# Patient Record
Sex: Male | Born: 1950 | Race: White | Hispanic: No | Marital: Married | Smoking: Never smoker
Health system: Southern US, Community
[De-identification: ages and names within clinical notes are randomized; demographics above are authoritative.]

## PROBLEM LIST (undated history)

## (undated) DIAGNOSIS — I10 Essential (primary) hypertension: Secondary | ICD-10-CM

## (undated) DIAGNOSIS — M1990 Unspecified osteoarthritis, unspecified site: Secondary | ICD-10-CM

## (undated) DIAGNOSIS — J45909 Unspecified asthma, uncomplicated: Secondary | ICD-10-CM

## (undated) HISTORY — PX: ABLATION SAPHENOUS VEIN W/ RFA: SUR11

## (undated) HISTORY — PX: OTHER SURGICAL HISTORY: SHX169

---

## 2020-05-26 ENCOUNTER — Other Ambulatory Visit: Payer: Self-pay | Admitting: Neurosurgery

## 2020-07-09 NOTE — H&P (Signed)
Patient ID:   [id number] Patient: Alvin Lee  Date of Birth: July 28, 1951 Visit Type: Office Visit   Date: 05/25/2020 03:15 PM Provider: Marchia Meiers. Vertell Limber MD   This 69 year old male presents for Injection follow-up/back pain.  HISTORY OF PRESENT ILLNESS: 1.  Injection follow-up/back pain  The patient underwent left L2-3 transforaminal epidural steroid injection on 03/17/2020.  He underwent left L3-4 injection on 04/06/2020.  He says that both injections gave him about 3-5 days of relief.  He is continuing to complain of weakness in his left leg.  I met with the patient and his wife today in the office.  He is currently complaining of 5/10 pain.  He denies any right leg symptoms.  I have recommended left L2-3 and left L3-4 foraminotomies for nerve root compression and lateral recess stenosis.  This will be performed at [facility] on July 14, 2020      Medical/Surgical/Interim History Reviewed, no change.  Last detailed document date:02/26/2020.     PAST MEDICAL HISTORY, SURGICAL HISTORY, FAMILY HISTORY, SOCIAL HISTORY AND REVIEW OF SYSTEMS I have reviewed the patient's past medical, surgical, family and social history as well as the comprehensive review of systems as included on the [facility] history form dated 02/12/2020, which I have signed.  Family History: Reviewed, no changes.  Last detailed document date:02/26/2020.   Social History: Reviewed, no changes. Last detailed document date: 02/26/2020.    MEDICATIONS: (added, continued or stopped this visit) Started Medication Directions Instruction Stopped  albuterol sulfate     losartan 100 mg-hydrochlorothiazide 25 mg tablet take 1 tablet by oral route  every day    magnesium oxide 500 mg tablet     Motrin IB     MULTIVITAMIN     rosuvastatin 40 mg tablet take 1 tablet by oral route  every day    Vitamin D3 100 mcg (4,000 unit)  capsule       ALLERGIES: Ingredient Reaction Medication Name Comment CLINDAMYCIN    LISINOPRIL    MOXIFLOXACIN     Reviewed, no changes.    PHYSICAL EXAM:  Vitals Date Temp F BP Pulse Ht In Wt Lb BMI BSA Pain Score 05/25/2020  171/80 93 71 219.2 30.57  5/10     IMPRESSION:  Proceed with L3-4 and L2-3 laminal foraminotomies with decompression of neural elements at these 2 levels  PLAN: This is been scheduled for 10th of August.  Patient wishes to proceed with surgery.  Risks and benefits of the surgery discussed in detail with the patient he wishes to proceed  Orders: Instruction(s)/Education: Assessment Instruction I10 Lifestyle education Z68.30 Dietary management education, guidance, and counseling  Completed Orders (this encounter) Order Details Reason Side Interpretation Result Initial Treatment Date Region Lifestyle education Patient will follow up with Primary Care Physician.       Dietary management education, guidance, and counseling Encouraged patient to eat well balanced diet.        Assessment/Plan  # Detail Type Description  1. Assessment Disc displacement, lumbar (M51.26).     2. Assessment Radiculopathy, lumbar region (M54.16).     3. Assessment Lumbar foraminal stenosis (M48.061).     4. Assessment Left leg weakness (R29.898).     5. Assessment Essential (primary) hypertension (I10).     6. Assessment Body mass index (BMI) 30.0-30.9, adult (Z68.30).  Plan Orders Today's instructions / counseling include(s) Dietary management education, guidance, and counseling. Clinical information/comments: Encouraged patient to eat well balanced diet.  Pain Management Plan Pain Scale: 5/10. Method: Numeric Pain Intensity Scale. Location: back. Onset: 02/26/2020. Duration: varies. Quality: discomforting. Pain management follow-up plan of care: Patient will continue  medication management..              Provider:  Marchia Meiers. Vertell Limber MD  05/30/2020 01:06 PM    Dictation edited by: Marchia Meiers. Vertell Limber    CC Providers: Dory Larsen [facility] [place],  [place]  [id number]-   Feliz Lincoln MD  [address] [place], [place] [address]               Electronically signed by Marchia Meiers. Vertell Limber MD on 05/30/2020 01:06 PM    Patient ID:   [id number] Patient: Alvin Lee  Date of Birth: Dec 17, 1950 Visit Type: Office Visit   Date: 02/26/2020 08:30 AM Provider: Marchia Meiers. Vertell Limber MD   This 69 year old male presents for back/left hip and thigh pain.  HISTORY OF PRESENT ILLNESS: 1.  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  He recalls no injury, noting severe symptoms began September 18, 2018 upon standing from a chair.  Severe back spasms prompted cancellation of his office that day.  Physical therapy offered some relief; but by the spring of 2020, he notice some left buttock and hip pain when taking steps. He has recently resumed physical therapy at [facility] pediatric therapy, initially noticing benefit, now plateaued.  Current symptoms include left hip and buttock pain with not left foot numbness upon standing long periods.  He notes some weakness in his left thigh with gait. Ortho cleared hip.  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) Surgical history:  Vein ablation 2011 [facility], sinus surgeries 2014 and 1991 at [facility], sinus surgery in [place], esophageal dilatation after upper GI bleed 2007  Lumbar MRI 11/20/2018 uploaded  canopy  The patient currently describes pain at 4 to 5/10 and he complains of pain into his left upper thigh hip and laterally over the greater trochanter on the left but never below his knee  [facility] and summer of 2020 for his left leg and underwent an SI joint injection x1 in April 2020 but says this did not help him much at all.  The patient a is currently weighing 216 lb he says at is best he was down to 206 and at is worst 3 years ago he was up to 235 lb  The patient describes that he is not able to get relief and that when he sits he has 18 pain and when he stands he has a catch in his left buttock.  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 and he has a smaller right leg with an intact knee joint but  wears a full length leg prosthesis which does not have an articulating knee and which therefore causes him to walk with a stiff right leg       PAST MEDICAL/SURGICAL HISTORY:   (Detailed)   Disease/disorder Onset Date Management Date Comments   Sinus surgery (3)     Vein ablation, 08/23/2010     Dilation esophageal stricture, 2007   Arthritis     Asthma     Gerd     Hypertension        PAST MEDICAL HISTORY, SURGICAL HISTORY, FAMILY HISTORY, SOCIAL HISTORY AND REVIEW OF SYSTEMS I have reviewed the patient's past medical, surgical, family and social history as well as the comprehensive review of systems as included on the [facility] history form dated 02/12/2020, which I have signed.  Family History:  (Detailed)   Social  History:  (Detailed) Tobacco use reviewed. Preferred language is [place].   Tobacco use status: Current non-smoker. Smoking status: Never smoker.  SMOKING STATUS Type Smoking Status Usage Per Day Years Used Total Pack Years  Never smoker         MEDICATIONS: (added, continued or stopped this visit) Started Medication Directions Instruction Stopped  albuterol sulfate     losartan 100 mg-hydrochlorothiazide 25 mg tablet take 1 tablet by oral route  every day    magnesium oxide 500 mg tablet     Motrin IB     Multi Vitamin     rosuvastatin 40 mg tablet take 1 tablet by oral route  every day    Vitamin D3 100 mcg (4,000 unit) capsule       ALLERGIES: Ingredient Reaction Medication Name Comment CLINDAMYCIN    LISINOPRIL    MOXIFLOXACIN     Reviewed, no changes.   REVIEW OF SYSTEMS  See scanned patient registration form, dated 02/12/2020, signed and dated on 02/26/2020  Review of Systems Details System Neg/Pos Details Constitutional Negative Chills, Fatigue, Fever, Malaise, Night sweats, Weight gain and Weight loss. ENMT Negative Ear drainage, Hearing loss, Nasal drainage, Otalgia, Sinus pressure and Sore throat. Eyes Negative Eye discharge, Eye pain and Vision changes. Respiratory Negative Chronic cough, Cough, Dyspnea, Known TB exposure and Wheezing. Cardio Negative Chest pain, Claudication, Edema and Irregular heartbeat/palpitations. GI Negative Abdominal pain, Blood in stool, Change in stool pattern, Constipation, Decreased appetite, Diarrhea, Heartburn, Nausea and Vomiting. GU Negative Dribbling, Dysuria, Erectile dysfunction, Hematuria, Polyuria (Genitourinary), Slow stream, Urinary frequency, Urinary incontinence and Urinary retention. Endocrine Negative Cold intolerance, Heat intolerance, Polydipsia and Polyphagia. Neuro Positive Extremity weakness, Gait disturbance, Numbness in  extremity. Psych Negative Anxiety, Depression and Insomnia. Integumentary Negative Brittle hair, Brittle nails, Change in shape/size of mole(s), Hair loss, Hirsutism, Hives, Pruritus, Rash and Skin lesion. MS Positive Back pain, Left hip pain. Hema/Lymph Negative Easy bleeding, Easy bruising and Lymphadenopathy. Allergic/Immuno Negative Contact allergy, Environmental allergies, Food allergies and Seasonal allergies. Reproductive Negative Penile discharge and Sexual dysfunction.  PHYSICAL EXAM:  Vitals Date Temp F BP Pulse Ht In Wt Lb BMI BSA Pain Score 02/26/2020 97.1 148/81 85 71 225.8 31.49  5/10   PHYSICAL EXAM Details General Level of Distress: no acute distress Overall Appearance: normal  Head and Face  Right Left  Fundoscopic Exam:  normal normal    Cardiovascular Cardiac: regular rate and rhythm without murmur  Right Left  Carotid Pulses: normal normal  Respiratory Lungs: clear to auscultation  Neurological Orientation: normal Recent and Remote Memory: normal Attention Span and Concentration:   normal Language: normal [employer]  of Knowledge: normal  Right Left Sensation: normal normal Upper Extremity Coordination: normal normal  Lower Extremity Coordination: normal normal  Musculoskeletal Gait and Station: normal  Right Left Upper Extremity Muscle Strength: normal normal Lower Extremity Muscle Strength: normal normal Upper Extremity Muscle Tone:  normal normal Lower Extremity Muscle Tone: normal normal   Motor Strength Upper and lower extremity motor strength was tested in the clinically pertinent muscles. Any abnormal findings will be noted below.   Right Left Hip Flexor:  4/5   Deep Tendon Reflexes  Right Left Biceps: normal normal Triceps: normal normal Brachioradialis: normal normal Patellar: normal normal Achilles: normal normal  Sensory Sensation was tested at L1 to S1.   Cranial Nerves II. Optic Nerve/Visual  Fields: normal III. Oculomotor: normal IV. Trochlear: normal V. Trigeminal: normal VI. Abducens: normal VII. Facial: normal VIII. Acoustic/Vestibular: normal IX. Glossopharyngeal: normal X. Vagus: normal XI. Spinal Accessory: normal XII. 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  Tenderness over left upper thigh and hip and laterally over the greater trochanter    IMPRESSION:  I believe that the patient has left leg weakness and pain relates to his spinal stenosis at the L2-3 level and is likely worsened by his abnormal gait due to his congenital anomaly of his right lower extremity, which causes more stress on the left side of his back and left leg  PLAN: I have recommended a lumbar MRI be obtained to see if there is been interval worsening of these degenerative changes in the lumbar spine, most concerning regarding the left L2-3 level.  I told him he may well do well with injections depending on the results of this new MRI and also lumbar radiographs to assess for any evidence of instability  Orders: Diagnostic Procedures: Assessment Procedure M25.552 Hip - Left Lateral W/AP Pelvis M54.16 Lumbar Spine- AP/Lat/Flex/Ex M54.16 MRI Spine/lumb W/o Contrast Instruction(s)/Education: Assessment Instruction I10 Lifestyle education Z68.31 Dietary management education, guidance, and counseling  Completed Orders (this encounter) Order Details Reason Side Interpretation Result Initial Treatment Date Region Lifestyle education Patient will follow up with  Primary Care Physician.       Dietary management education, guidance, and counseling Encouraged patient to eat well balanced diet.        Assessment/Plan  # Detail Type Description  1. Assessment Disc displacement, lumbar (M51.26).     2. 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     3. Assessment Left hip pain (M25.552).     4. Assessment Lumbar radiculopathy (M54.16).     5. Assessment Essential (primary) hypertension (I10).     6. Assessment Body mass index (BMI) 31.0-31.9, adult (Z68.31).  Plan Orders Today's instructions / counseling include(s) Dietary management education, guidance, and counseling. Clinical information/comments: Encouraged patient to eat well balanced diet.       Pain Management Plan Pain Scale: 5/10. Method: Numeric Pain Intensity Scale. Location: back, left hip and thigh. Onset: 02/26/2020. Duration: varies. Quality: discomforting. Pain management follow-up plan of care: Patient will continue medication management..              Provider:  Marchia Meiers. Vertell Limber MD  03/01/2020 02:43 PM  Dictation edited by: Marchia Meiers Vertell Limber    CC Providers: Dory Larsen [facility] [place],  [place]  [id number]-   Felicita Nuncio MD  [address] [place], [place] [address]               Electronically signed by Marchia Meiers. Vertell Limber MD on 03/01/2020 02:43 PM

## 2020-07-10 ENCOUNTER — Encounter (HOSPITAL_COMMUNITY)
Admission: RE | Admit: 2020-07-10 | Discharge: 2020-07-10 | Disposition: A | Discharge status: Home / Self Care | Payer: Medicare Other | Source: Ambulatory Visit | Attending: Neurosurgery | Admitting: Neurosurgery

## 2020-07-10 ENCOUNTER — Other Ambulatory Visit (HOSPITAL_COMMUNITY)
Admission: RE | Admit: 2020-07-10 | Discharge: 2020-07-10 | Disposition: A | Discharge status: Home / Self Care | Payer: Medicare Other | Source: Ambulatory Visit | Attending: Neurosurgery | Admitting: Neurosurgery

## 2020-07-10 ENCOUNTER — Encounter (HOSPITAL_COMMUNITY): Payer: Self-pay

## 2020-07-10 ENCOUNTER — Other Ambulatory Visit: Payer: Self-pay

## 2020-07-10 DIAGNOSIS — Z20822 Contact with and (suspected) exposure to covid-19: Secondary | ICD-10-CM | POA: Insufficient documentation

## 2020-07-10 DIAGNOSIS — Z01818 Encounter for other preprocedural examination: Secondary | ICD-10-CM | POA: Diagnosis present

## 2020-07-10 DIAGNOSIS — Z01812 Encounter for preprocedural laboratory examination: Secondary | ICD-10-CM | POA: Insufficient documentation

## 2020-07-10 HISTORY — DX: Unspecified asthma, uncomplicated: J45.909

## 2020-07-10 HISTORY — DX: Unspecified osteoarthritis, unspecified site: M19.90

## 2020-07-10 HISTORY — DX: Essential (primary) hypertension: I10

## 2020-07-10 LAB — CBC
HCT: 49 % (ref 39.0–52.0)
Hemoglobin: 16.2 g/dL (ref 13.0–17.0)
MCH: 31 pg (ref 26.0–34.0)
MCHC: 33.1 g/dL (ref 30.0–36.0)
MCV: 93.7 fL (ref 80.0–100.0)
Platelets: 206 10*3/uL (ref 150–400)
RBC: 5.23 MIL/uL (ref 4.22–5.81)
RDW: 12.7 % (ref 11.5–15.5)
WBC: 6 10*3/uL (ref 4.0–10.5)
nRBC: 0 % (ref 0.0–0.2)

## 2020-07-10 LAB — SARS CORONAVIRUS 2 (TAT 6-24 HRS): SARS Coronavirus 2: NEGATIVE

## 2020-07-10 LAB — BASIC METABOLIC PANEL
Anion gap: 9 (ref 5–15)
BUN: 23 mg/dL (ref 8–23)
CO2: 30 mmol/L (ref 22–32)
Calcium: 10 mg/dL (ref 8.9–10.3)
Chloride: 102 mmol/L (ref 98–111)
Creatinine, Ser: 1.15 mg/dL (ref 0.61–1.24)
GFR calc Af Amer: >60 mL/min (ref >60)
GFR calc non Af Amer: >60 mL/min (ref >60)
Glucose, Bld: 111 mg/dL — ABNORMAL HIGH (ref 70–99)
Potassium: 3.9 mmol/L (ref 3.5–5.1)
Sodium: 141 mmol/L (ref 135–145)

## 2020-07-10 LAB — SURGICAL PCR SCREEN
MRSA, PCR: NEGATIVE
Staphylococcus aureus: POSITIVE — AB

## 2020-07-10 MED ORDER — CHLORHEXIDINE GLUCONATE CLOTH 2 % EX PADS: 6.0000 | MEDICATED_PAD | Freq: Once | CUTANEOUS | Status: DC

## 2020-07-10 NOTE — Progress Notes (Signed)
[facility] - [place], [place] - [address] [facility] [address] [place] [address] Phone: [phone] Fax: [phone]  [facility] [phone] [place], [place] - [address] AT [facility] [address] [place] [address] Phone: [phone] Fax: [phone]      Your procedure is scheduled on 07/14/20.  Report to [facility] Main Entrance "A" at 5:30 A.M., and check in at the Admitting office.  Call this number if you have problems the morning of surgery:  [phone]  Call [phone] if you have any questions prior to your surgery date Monday-Friday 8am-4pm    Remember:  Do not eat or drink after midnight the night before your surgery    Take these medicines the morning of surgery with A SIP OF WATER: omeprazole (PRILOSEC)   As of today, STOP taking any Aspirin (unless otherwise instructed by your surgeon) Aleve, Naproxen, Ibuprofen, Motrin, Advil, Goody's, BC's, all herbal medications, fish oil, and all vitamins.                      Do not wear jewelry            Do not wear lotions, powders, perfumes or deodorant.            Do not shave 48 hours prior to surgery.              Do not bring valuables to the hospital.            [facility] is not responsible for any belongings or valuables.  Do NOT Smoke (Tobacco/Vaping) or drink Alcohol 24 hours prior to your procedure If you use a CPAP at night, you may bring all equipment for your overnight stay.   Contacts, glasses, dentures or bridgework may not be worn into surgery.      For patients admitted to the hospital, discharge time will be determined by your treatment team.   Patients discharged the day of surgery will not be allowed to drive home, and someone needs to stay with them for 24 hours.    Special instructions:   [place]- Preparing For Surgery  Before surgery, you can play an important role. Because skin is not sterile, your skin needs to be as free of germs  as possible. You can reduce the number of germs on your skin by washing with CHG (chlorahexidine gluconate) Soap before surgery.  CHG is an antiseptic cleaner which kills germs and bonds with the skin to continue killing germs even after washing.    Oral Hygiene is also important to reduce your risk of infection.  Remember - BRUSH YOUR TEETH THE MORNING OF SURGERY WITH YOUR REGULAR TOOTHPASTE  Please do not use if you have an allergy to CHG or antibacterial soaps. If your skin becomes reddened/irritated stop using the CHG.  Do not shave (including legs and underarms) for at least 48 hours prior to first CHG shower. It is OK to shave your face.  Please follow these instructions carefully.   1. Shower the NIGHT BEFORE SURGERY and the MORNING OF SURGERY with CHG Soap.   2. If you chose to wash your hair, wash your hair first as usual with your normal shampoo.  3. After you shampoo, rinse your hair and body thoroughly to remove the shampoo.  4. Use CHG as you would any other liquid soap. You can apply CHG directly to the skin and wash gently with a scrungie or a clean  washcloth.   5. Apply the CHG Soap to your body ONLY FROM THE NECK DOWN.  Do not use on open wounds or open sores. Avoid contact with your eyes, ears, mouth and genitals (private parts). Wash Face and genitals (private parts)  with your normal soap.   6. Wash thoroughly, paying special attention to the area where your surgery will be performed.  7. Thoroughly rinse your body with warm water from the neck down.  8. DO NOT shower/wash with your normal soap after using and rinsing off the CHG Soap.  9. Pat yourself dry with a CLEAN TOWEL.  10. Wear CLEAN PAJAMAS to bed the night before surgery  11. Place CLEAN SHEETS on your bed the night of your first shower and DO NOT SLEEP WITH PETS.   Day of Surgery: Wear Clean/Comfortable clothing the morning of surgery Do not apply any deodorants/lotions.   Remember to brush your teeth  WITH YOUR REGULAR TOOTHPASTE.   Please read over the following fact sheets that you were given.

## 2020-07-10 NOTE — Progress Notes (Signed)
PCP - M [facility]ED[facility]   [facility]     [phone] Cardiologist - NA      -         Chest x-ray - NA EKG - TODAY Stress Test - NA ECHO - NA Cardiac Cath - NA   -             Blood Thinner Instructions:NO Aspirin Instructions:STOP    COVID TEST- FOR TODAY   Anesthesia review:  HX HTN Patient denies shortness of breath, fever, cough and chest pain at PAT appointment   All instructions explained to the patient, with a verbal understanding of the material. Patient agrees to go over the instructions while at home for a better understanding. Patient also instructed to self quarantine after being tested for COVID-19. The opportunity to ask questions was provided.

## 2020-07-13 NOTE — Anesthesia Preprocedure Evaluation (Addendum)
Anesthesia Evaluation  Patient identified by MRN, date of birth, ID band Patient awake    Reviewed: Allergy & Precautions, NPO status , Patient's Chart, lab work & pertinent test results  Airway Mallampati: I  TM Distance: >3 FB Neck ROM: Full    Dental  (+) Teeth Intact, Dental Advisory Given   Pulmonary asthma ,  Uses rescue inhaler very infrequently    Pulmonary exam normal breath sounds clear to auscultation       Cardiovascular hypertension, Pt. on medications Normal cardiovascular exam Rhythm:Regular Rate:Normal     Neuro/Psych negative neurological ROS  negative psych ROS   GI/Hepatic Neg liver ROS, GERD  Medicated and Controlled,  Endo/Other  negative endocrine ROS  Renal/GU negative Renal ROS  negative genitourinary   Musculoskeletal  (+) Arthritis , Osteoarthritis,  Lumbar stenosis    Abdominal   Peds  Hematology negative hematology ROS (+) hct 49   Anesthesia Other Findings   Reproductive/Obstetrics negative OB ROS                           Anesthesia Physical Anesthesia Plan  ASA: II  Anesthesia Plan: General   Post-op Pain Management:    Induction: Intravenous  PONV Risk Score and Plan: 2 and Ondansetron, Dexamethasone, Treatment may vary due to age or medical condition and Midazolam  Airway Management Planned: Oral ETT  Additional Equipment: None  Intra-op Plan:   Post-operative Plan: Extubation in OR  Informed Consent: I have reviewed the patients History and Physical, chart, labs and discussed the procedure including the risks, benefits and alternatives for the proposed anesthesia with the patient or authorized representative who has indicated his/her understanding and acceptance.     Dental advisory given  Plan Discussed with: CRNA  Anesthesia Plan Comments: (  )     Anesthesia Quick Evaluation

## 2020-07-14 ENCOUNTER — Encounter (HOSPITAL_COMMUNITY): Payer: Self-pay | Admitting: Neurosurgery

## 2020-07-14 ENCOUNTER — Ambulatory Visit (HOSPITAL_COMMUNITY): Payer: Medicare Other | Admitting: Physician Assistant

## 2020-07-14 ENCOUNTER — Ambulatory Visit (HOSPITAL_COMMUNITY): Payer: Medicare Other

## 2020-07-14 ENCOUNTER — Encounter (HOSPITAL_COMMUNITY)
Admission: RE | Disposition: A | Discharge status: Home / Self Care | Payer: Self-pay | Source: Home / Self Care | Attending: Neurosurgery

## 2020-07-14 ENCOUNTER — Ambulatory Visit (HOSPITAL_COMMUNITY): Payer: Medicare Other | Admitting: Certified Registered Nurse Anesthetist

## 2020-07-14 ENCOUNTER — Observation Stay (HOSPITAL_COMMUNITY)
Admission: RE | Admit: 2020-07-14 | Discharge: 2020-07-15 | Disposition: A | Discharge status: Home / Self Care | Payer: Medicare Other | Attending: Neurosurgery | Admitting: Neurosurgery

## 2020-07-14 ENCOUNTER — Other Ambulatory Visit: Payer: Self-pay

## 2020-07-14 DIAGNOSIS — I10 Essential (primary) hypertension: Secondary | ICD-10-CM | POA: Insufficient documentation

## 2020-07-14 DIAGNOSIS — M5126 Other intervertebral disc displacement, lumbar region: Secondary | ICD-10-CM | POA: Diagnosis not present

## 2020-07-14 DIAGNOSIS — Z6830 Body mass index (BMI) 30.0-30.9, adult: Secondary | ICD-10-CM | POA: Diagnosis not present

## 2020-07-14 DIAGNOSIS — Z79899 Other long term (current) drug therapy: Secondary | ICD-10-CM | POA: Insufficient documentation

## 2020-07-14 DIAGNOSIS — M5416 Radiculopathy, lumbar region: Secondary | ICD-10-CM | POA: Diagnosis not present

## 2020-07-14 DIAGNOSIS — M48061 Spinal stenosis, lumbar region without neurogenic claudication: Principal | ICD-10-CM | POA: Insufficient documentation

## 2020-07-14 DIAGNOSIS — M545 Low back pain: Secondary | ICD-10-CM | POA: Diagnosis present

## 2020-07-14 DIAGNOSIS — Z419 Encounter for procedure for purposes other than remedying health state, unspecified: Secondary | ICD-10-CM

## 2020-07-14 HISTORY — PX: LUMBAR LAMINECTOMY/DECOMPRESSION MICRODISCECTOMY: SHX5026

## 2020-07-14 SURGERY — LUMBAR LAMINECTOMY/DECOMPRESSION MICRODISCECTOMY 2 LEVELS
Anesthesia: General | Laterality: Left

## 2020-07-14 MED ORDER — CHLORHEXIDINE GLUCONATE 0.12 % MT SOLN: 15.0000 mL | Freq: Once | OROMUCOSAL | Status: AC

## 2020-07-14 MED ORDER — PHENOL 1.4 % MT LIQD: 1.0000 | OROMUCOSAL | Status: DC | PRN

## 2020-07-14 MED ORDER — DOCUSATE SODIUM 100 MG PO CAPS
100.0000 mg | ORAL_CAPSULE | Freq: Two times a day (BID) | ORAL | Status: DC
  Administered 2020-07-14 – 2020-07-15 (×3): 100 mg via ORAL
  Filled 2020-07-14 (×3): qty 1

## 2020-07-14 MED ORDER — ROSUVASTATIN CALCIUM 20 MG PO TABS
40.0000 mg | ORAL_TABLET | Freq: Every day | ORAL | Status: DC
  Administered 2020-07-14: 40 mg via ORAL
  Filled 2020-07-14: qty 2

## 2020-07-14 MED ORDER — HYDROMORPHONE HCL 1 MG/ML IJ SOLN: 0.5000 mg | INTRAMUSCULAR | Status: DC | PRN

## 2020-07-14 MED ORDER — CHLORHEXIDINE GLUCONATE 0.12 % MT SOLN
OROMUCOSAL | Status: AC
  Administered 2020-07-14: 15 mL via OROMUCOSAL
  Filled 2020-07-14: qty 15

## 2020-07-14 MED ORDER — PHENYLEPHRINE 40 MCG/ML (10ML) SYRINGE FOR IV PUSH (FOR BLOOD PRESSURE SUPPORT)
PREFILLED_SYRINGE | INTRAVENOUS | Status: DC | PRN
  Administered 2020-07-14: 80 ug via INTRAVENOUS
  Administered 2020-07-14: 120 ug via INTRAVENOUS
  Administered 2020-07-14 (×5): 80 ug via INTRAVENOUS

## 2020-07-14 MED ORDER — LIDOCAINE 2% (20 MG/ML) 5 ML SYRINGE
INTRAMUSCULAR | Status: DC | PRN
  Administered 2020-07-14: 60 mg via INTRAVENOUS

## 2020-07-14 MED ORDER — PROPOFOL 10 MG/ML IV BOLUS
INTRAVENOUS | Status: DC | PRN
  Administered 2020-07-14: 150 mg via INTRAVENOUS

## 2020-07-14 MED ORDER — LIDOCAINE-EPINEPHRINE 1 %-1:100000 IJ SOLN
INTRAMUSCULAR | Status: DC | PRN
  Administered 2020-07-14: 5 mL

## 2020-07-14 MED ORDER — LOSARTAN POTASSIUM-HCTZ 100-25 MG PO TABS: 1.0000 | ORAL_TABLET | Freq: Every day | ORAL | Status: DC

## 2020-07-14 MED ORDER — THROMBIN 5000 UNITS EX SOLR
OROMUCOSAL | Status: DC | PRN
  Administered 2020-07-14: 5 mL via TOPICAL

## 2020-07-14 MED ORDER — IBUPROFEN 200 MG PO TABS: 600.0000 mg | ORAL_TABLET | Freq: Four times a day (QID) | ORAL | Status: DC | PRN

## 2020-07-14 MED ORDER — MIDAZOLAM HCL 2 MG/2ML IJ SOLN
INTRAMUSCULAR | Status: DC | PRN
  Administered 2020-07-14: 2 mg via INTRAVENOUS

## 2020-07-14 MED ORDER — METHOCARBAMOL 500 MG PO TABS
500.0000 mg | ORAL_TABLET | Freq: Four times a day (QID) | ORAL | Status: DC | PRN
  Administered 2020-07-14: 500 mg via ORAL
  Filled 2020-07-14: qty 1

## 2020-07-14 MED ORDER — OXYCODONE HCL 5 MG PO TABS
5.0000 mg | ORAL_TABLET | Freq: Once | ORAL | Status: AC | PRN
  Administered 2020-07-14: 5 mg via ORAL

## 2020-07-14 MED ORDER — EPHEDRINE 5 MG/ML INJ
INTRAVENOUS | Status: AC
  Filled 2020-07-14: qty 10

## 2020-07-14 MED ORDER — DEXAMETHASONE SODIUM PHOSPHATE 10 MG/ML IJ SOLN
INTRAMUSCULAR | Status: DC | PRN
  Administered 2020-07-14: 10 mg via INTRAVENOUS

## 2020-07-14 MED ORDER — SODIUM CHLORIDE 0.9 % IV SOLN: 250.0000 mL | INTRAVENOUS | Status: DC

## 2020-07-14 MED ORDER — SODIUM CHLORIDE 0.9% FLUSH: 3.0000 mL | INTRAVENOUS | Status: DC | PRN

## 2020-07-14 MED ORDER — POLYETHYLENE GLYCOL 3350 17 G PO PACK: 17.0000 g | PACK | Freq: Every day | ORAL | Status: DC | PRN

## 2020-07-14 MED ORDER — ROCURONIUM BROMIDE 10 MG/ML (PF) SYRINGE
PREFILLED_SYRINGE | INTRAVENOUS | Status: DC | PRN
  Administered 2020-07-14: 100 mg via INTRAVENOUS

## 2020-07-14 MED ORDER — FLEET ENEMA 7-19 GM/118ML RE ENEM: 1.0000 | ENEMA | Freq: Once | RECTAL | Status: DC | PRN

## 2020-07-14 MED ORDER — KCL IN DEXTROSE-NACL 20-5-0.45 MEQ/L-%-% IV SOLN: INTRAVENOUS | Status: DC

## 2020-07-14 MED ORDER — FENTANYL CITRATE (PF) 250 MCG/5ML IJ SOLN
INTRAMUSCULAR | Status: AC
  Filled 2020-07-14: qty 5

## 2020-07-14 MED ORDER — ACETAMINOPHEN 500 MG PO TABS
1000.0000 mg | ORAL_TABLET | Freq: Once | ORAL | Status: AC
  Administered 2020-07-14: 1000 mg via ORAL
  Filled 2020-07-14: qty 2

## 2020-07-14 MED ORDER — PROPOFOL 10 MG/ML IV BOLUS
INTRAVENOUS | Status: AC
  Filled 2020-07-14: qty 40

## 2020-07-14 MED ORDER — ALUM & MAG HYDROXIDE-SIMETH 200-200-20 MG/5ML PO SUSP: 30.0000 mL | Freq: Four times a day (QID) | ORAL | Status: DC | PRN

## 2020-07-14 MED ORDER — ACETAMINOPHEN 650 MG RE SUPP: 650.0000 mg | RECTAL | Status: DC | PRN

## 2020-07-14 MED ORDER — SUGAMMADEX SODIUM 200 MG/2ML IV SOLN
INTRAVENOUS | Status: DC | PRN
  Administered 2020-07-14: 200 mg via INTRAVENOUS

## 2020-07-14 MED ORDER — LIDOCAINE 2% (20 MG/ML) 5 ML SYRINGE
INTRAMUSCULAR | Status: AC
  Filled 2020-07-14: qty 5

## 2020-07-14 MED ORDER — FENTANYL CITRATE (PF) 100 MCG/2ML IJ SOLN
INTRAMUSCULAR | Status: DC | PRN
  Administered 2020-07-14: 100 ug via INTRAVENOUS

## 2020-07-14 MED ORDER — MENTHOL 3 MG MT LOZG: 1.0000 | LOZENGE | OROMUCOSAL | Status: DC | PRN

## 2020-07-14 MED ORDER — LOSARTAN POTASSIUM 50 MG PO TABS
100.0000 mg | ORAL_TABLET | Freq: Every day | ORAL | Status: DC
  Administered 2020-07-14 – 2020-07-15 (×2): 100 mg via ORAL
  Filled 2020-07-14 (×2): qty 2

## 2020-07-14 MED ORDER — PHENYLEPHRINE 40 MCG/ML (10ML) SYRINGE FOR IV PUSH (FOR BLOOD PRESSURE SUPPORT)
PREFILLED_SYRINGE | INTRAVENOUS | Status: AC
  Filled 2020-07-14: qty 10

## 2020-07-14 MED ORDER — ORAL CARE MOUTH RINSE: 15.0000 mL | Freq: Once | OROMUCOSAL | Status: AC

## 2020-07-14 MED ORDER — PHENYLEPHRINE HCL-NACL 10-0.9 MG/250ML-% IV SOLN
INTRAVENOUS | Status: DC | PRN
  Administered 2020-07-14: 20 ug/min via INTRAVENOUS

## 2020-07-14 MED ORDER — OXYCODONE HCL 5 MG/5ML PO SOLN: 5.0000 mg | Freq: Once | ORAL | Status: AC | PRN

## 2020-07-14 MED ORDER — ACETAMINOPHEN 325 MG PO TABS: 650.0000 mg | ORAL_TABLET | ORAL | Status: DC | PRN

## 2020-07-14 MED ORDER — BUPIVACAINE HCL (PF) 0.5 % IJ SOLN
INTRAMUSCULAR | Status: DC | PRN
  Administered 2020-07-14: 5 mL

## 2020-07-14 MED ORDER — HYDROCHLOROTHIAZIDE 25 MG PO TABS
25.0000 mg | ORAL_TABLET | Freq: Every day | ORAL | Status: DC
  Administered 2020-07-14 – 2020-07-15 (×2): 25 mg via ORAL
  Filled 2020-07-14 (×2): qty 1

## 2020-07-14 MED ORDER — PANTOPRAZOLE SODIUM 40 MG IV SOLR
40.0000 mg | Freq: Every day | INTRAVENOUS | Status: DC
  Administered 2020-07-14: 40 mg via INTRAVENOUS
  Filled 2020-07-14: qty 40

## 2020-07-14 MED ORDER — ONDANSETRON HCL 4 MG PO TABS: 4.0000 mg | ORAL_TABLET | Freq: Four times a day (QID) | ORAL | Status: DC | PRN

## 2020-07-14 MED ORDER — ADULT MULTIVITAMIN W/MINERALS CH
1.0000 | ORAL_TABLET | Freq: Every day | ORAL | Status: DC
  Administered 2020-07-14 – 2020-07-15 (×2): 1 via ORAL
  Filled 2020-07-14 (×2): qty 1

## 2020-07-14 MED ORDER — HYDROCODONE-ACETAMINOPHEN 5-325 MG PO TABS
1.0000 | ORAL_TABLET | ORAL | Status: DC | PRN
  Administered 2020-07-14 – 2020-07-15 (×4): 1 via ORAL
  Filled 2020-07-14: qty 1
  Filled 2020-07-14: qty 2
  Filled 2020-07-14 (×2): qty 1

## 2020-07-14 MED ORDER — MIDAZOLAM HCL 2 MG/2ML IJ SOLN
INTRAMUSCULAR | Status: AC
  Filled 2020-07-14: qty 2

## 2020-07-14 MED ORDER — ZOLPIDEM TARTRATE 5 MG PO TABS: 5.0000 mg | ORAL_TABLET | Freq: Every evening | ORAL | Status: DC | PRN

## 2020-07-14 MED ORDER — ONDANSETRON HCL 4 MG/2ML IJ SOLN
INTRAMUSCULAR | Status: AC
  Filled 2020-07-14: qty 2

## 2020-07-14 MED ORDER — OXYCODONE HCL 5 MG PO TABS
5.0000 mg | ORAL_TABLET | ORAL | Status: DC | PRN
  Filled 2020-07-14: qty 1

## 2020-07-14 MED ORDER — ONDANSETRON HCL 4 MG/2ML IJ SOLN: 4.0000 mg | Freq: Four times a day (QID) | INTRAMUSCULAR | Status: DC | PRN

## 2020-07-14 MED ORDER — OXYCODONE HCL 5 MG PO TABS
ORAL_TABLET | ORAL | Status: AC
  Filled 2020-07-14: qty 1

## 2020-07-14 MED ORDER — METHOCARBAMOL 1000 MG/10ML IJ SOLN
500.0000 mg | Freq: Four times a day (QID) | INTRAVENOUS | Status: DC | PRN
  Filled 2020-07-14: qty 5

## 2020-07-14 MED ORDER — CEFAZOLIN SODIUM-DEXTROSE 2-4 GM/100ML-% IV SOLN
2.0000 g | Freq: Three times a day (TID) | INTRAVENOUS | Status: AC
  Administered 2020-07-14 (×2): 2 g via INTRAVENOUS
  Filled 2020-07-14 (×2): qty 100

## 2020-07-14 MED ORDER — SODIUM CHLORIDE 0.9% FLUSH
3.0000 mL | Freq: Two times a day (BID) | INTRAVENOUS | Status: DC
  Administered 2020-07-15: 3 mL via INTRAVENOUS

## 2020-07-14 MED ORDER — 0.9 % SODIUM CHLORIDE (POUR BTL) OPTIME
TOPICAL | Status: DC | PRN
  Administered 2020-07-14: 1000 mL

## 2020-07-14 MED ORDER — METHYLPREDNISOLONE ACETATE 80 MG/ML IJ SUSP
INTRAMUSCULAR | Status: DC | PRN
  Administered 2020-07-14: 80 mg

## 2020-07-14 MED ORDER — CEFAZOLIN SODIUM-DEXTROSE 2-4 GM/100ML-% IV SOLN
2.0000 g | INTRAVENOUS | Status: AC
  Administered 2020-07-14: 2 g via INTRAVENOUS
  Filled 2020-07-14: qty 100

## 2020-07-14 MED ORDER — PANTOPRAZOLE SODIUM 40 MG PO TBEC: 40.0000 mg | DELAYED_RELEASE_TABLET | Freq: Every day | ORAL | Status: DC

## 2020-07-14 MED ORDER — VITAMIN D 25 MCG (1000 UNIT) PO TABS
2000.0000 [IU] | ORAL_TABLET | Freq: Every day | ORAL | Status: DC
  Administered 2020-07-14 – 2020-07-15 (×2): 2000 [IU] via ORAL
  Filled 2020-07-14 (×2): qty 2

## 2020-07-14 MED ORDER — LIDOCAINE-EPINEPHRINE 1 %-1:100000 IJ SOLN
INTRAMUSCULAR | Status: AC
  Filled 2020-07-14: qty 1

## 2020-07-14 MED ORDER — FENTANYL CITRATE (PF) 100 MCG/2ML IJ SOLN
INTRAMUSCULAR | Status: AC
  Filled 2020-07-14: qty 2

## 2020-07-14 MED ORDER — BUPIVACAINE HCL (PF) 0.5 % IJ SOLN
INTRAMUSCULAR | Status: AC
  Filled 2020-07-14: qty 30

## 2020-07-14 MED ORDER — METHYLPREDNISOLONE ACETATE 80 MG/ML IJ SUSP
INTRAMUSCULAR | Status: AC
  Filled 2020-07-14: qty 1

## 2020-07-14 MED ORDER — ONDANSETRON HCL 4 MG/2ML IJ SOLN: 4.0000 mg | Freq: Once | INTRAMUSCULAR | Status: DC | PRN

## 2020-07-14 MED ORDER — BISACODYL 10 MG RE SUPP: 10.0000 mg | Freq: Every day | RECTAL | Status: DC | PRN

## 2020-07-14 MED ORDER — LACTATED RINGERS IV SOLN: INTRAVENOUS | Status: DC

## 2020-07-14 MED ORDER — HYDROMORPHONE HCL 1 MG/ML IJ SOLN: 0.2500 mg | INTRAMUSCULAR | Status: DC | PRN

## 2020-07-14 MED ORDER — DEXAMETHASONE SODIUM PHOSPHATE 10 MG/ML IJ SOLN
INTRAMUSCULAR | Status: AC
  Filled 2020-07-14: qty 1

## 2020-07-14 MED ORDER — ONDANSETRON HCL 4 MG/2ML IJ SOLN
INTRAMUSCULAR | Status: DC | PRN
  Administered 2020-07-14: 4 mg via INTRAVENOUS

## 2020-07-14 MED ORDER — FENTANYL CITRATE (PF) 250 MCG/5ML IJ SOLN
INTRAMUSCULAR | Status: DC | PRN
  Administered 2020-07-14: 100 ug via INTRAVENOUS
  Administered 2020-07-14: 50 ug via INTRAVENOUS

## 2020-07-14 MED ORDER — ROCURONIUM BROMIDE 10 MG/ML (PF) SYRINGE
PREFILLED_SYRINGE | INTRAVENOUS | Status: AC
  Filled 2020-07-14: qty 10

## 2020-07-14 MED ORDER — THROMBIN 5000 UNITS EX SOLR
CUTANEOUS | Status: AC
  Filled 2020-07-14: qty 5000

## 2020-07-14 SURGICAL SUPPLY — 52 items
BAND RUBBER #18 3X1/16 STRL (MISCELLANEOUS) ×4 IMPLANT
BLADE CLIPPER SURG (BLADE) IMPLANT
BUR MATCHSTICK NEURO 3.0 LAGG (BURR) ×2 IMPLANT
BUR ROUND FLUTED 5 RND (BURR) ×2 IMPLANT
CANISTER SUCT 3000ML PPV (MISCELLANEOUS) ×2 IMPLANT
CARTRIDGE OIL MAESTRO DRILL (MISCELLANEOUS) ×1 IMPLANT
COVER WAND RF STERILE (DRAPES) IMPLANT
DECANTER SPIKE VIAL GLASS SM (MISCELLANEOUS) ×2 IMPLANT
DERMABOND ADVANCED (GAUZE/BANDAGES/DRESSINGS) ×1
DERMABOND ADVANCED .7 DNX12 (GAUZE/BANDAGES/DRESSINGS) ×1 IMPLANT
DIFFUSER DRILL AIR PNEUMATIC (MISCELLANEOUS) ×2 IMPLANT
DRAPE LAPAROTOMY 100X72X124 (DRAPES) ×2 IMPLANT
DRAPE MICROSCOPE LEICA (MISCELLANEOUS) ×2 IMPLANT
DRAPE SURG 17X23 STRL (DRAPES) ×2 IMPLANT
DRSG OPSITE 4X5.5 SM (GAUZE/BANDAGES/DRESSINGS) ×2 IMPLANT
DURAPREP 26ML APPLICATOR (WOUND CARE) ×2 IMPLANT
ELECT REM PT RETURN 9FT ADLT (ELECTROSURGICAL) ×2
ELECTRODE REM PT RTRN 9FT ADLT (ELECTROSURGICAL) ×1 IMPLANT
GAUZE 4X4 16PLY RFD (DISPOSABLE) IMPLANT
GAUZE SPONGE 4X4 12PLY STRL (GAUZE/BANDAGES/DRESSINGS) IMPLANT
GLOVE BIO SURGEON STRL SZ8 (GLOVE) ×2 IMPLANT
GLOVE BIOGEL PI IND STRL 8 (GLOVE) ×1 IMPLANT
GLOVE BIOGEL PI IND STRL 8.5 (GLOVE) ×1 IMPLANT
GLOVE BIOGEL PI INDICATOR 8 (GLOVE) ×1
GLOVE BIOGEL PI INDICATOR 8.5 (GLOVE) ×1
GLOVE ECLIPSE 8.0 STRL XLNG CF (GLOVE) ×2 IMPLANT
GLOVE EXAM NITRILE XL STR (GLOVE) IMPLANT
GOWN STRL REUS W/ TWL LRG LVL3 (GOWN DISPOSABLE) IMPLANT
GOWN STRL REUS W/ TWL XL LVL3 (GOWN DISPOSABLE) ×1 IMPLANT
GOWN STRL REUS W/TWL 2XL LVL3 (GOWN DISPOSABLE) ×2 IMPLANT
GOWN STRL REUS W/TWL LRG LVL3 (GOWN DISPOSABLE)
GOWN STRL REUS W/TWL XL LVL3 (GOWN DISPOSABLE) ×1
HEMOSTAT POWDER KIT SURGIFOAM (HEMOSTASIS) ×2 IMPLANT
KIT BASIN OR (CUSTOM PROCEDURE TRAY) ×2 IMPLANT
KIT TURNOVER KIT B (KITS) ×2 IMPLANT
NEEDLE HYPO 18GX1.5 BLUNT FILL (NEEDLE) ×2 IMPLANT
NEEDLE HYPO 25X1 1.5 SAFETY (NEEDLE) ×2 IMPLANT
NEEDLE SPNL 18GX3.5 QUINCKE PK (NEEDLE) ×2 IMPLANT
NS IRRIG 1000ML POUR BTL (IV SOLUTION) ×2 IMPLANT
OIL CARTRIDGE MAESTRO DRILL (MISCELLANEOUS) ×2
PACK LAMINECTOMY NEURO (CUSTOM PROCEDURE TRAY) ×2 IMPLANT
PAD ARMBOARD 7.5X6 YLW CONV (MISCELLANEOUS) ×6 IMPLANT
SPONGE SURGIFOAM ABS GEL SZ50 (HEMOSTASIS) IMPLANT
STAPLER SKIN PROX WIDE 3.9 (STAPLE) IMPLANT
SUT VIC AB 0 CT1 18XCR BRD8 (SUTURE) ×1 IMPLANT
SUT VIC AB 0 CT1 8-18 (SUTURE) ×1
SUT VIC AB 2-0 CT1 18 (SUTURE) ×2 IMPLANT
SUT VIC AB 3-0 SH 8-18 (SUTURE) ×2 IMPLANT
SYR 5ML LL (SYRINGE) ×2 IMPLANT
TOWEL GREEN STERILE (TOWEL DISPOSABLE) ×2 IMPLANT
TOWEL GREEN STERILE FF (TOWEL DISPOSABLE) ×2 IMPLANT
WATER STERILE IRR 1000ML POUR (IV SOLUTION) ×2 IMPLANT

## 2020-07-14 NOTE — Transfer of Care (Signed)
Immediate Anesthesia Transfer of Care Note  Patient: Alvin Lee  Procedure(s) Performed: Left Lumbar Two-Three, Left Lumbar Three-Four Foraminotomies (Left )  Patient Location: PACU  Anesthesia Type:General  Level of Consciousness: awake and oriented  Airway & Oxygen Therapy: Patient Spontanous Breathing  Post-op Assessment: Report given to RN, Post -op Vital signs reviewed and stable and Patient moving all extremities X 4  Post vital signs: Reviewed and stable  Last Vitals:  Vitals Value Taken Time  BP 123/77   Temp    Pulse 82 07/14/20 0925  Resp 21 07/14/20 0925  SpO2 90 % 07/14/20 0925  Vitals shown include unvalidated device data.  Last Pain:  Vitals:   07/14/20 0634  TempSrc:   PainSc: 4          Complications: No complications documented.

## 2020-07-14 NOTE — Evaluation (Signed)
Occupational Therapy Evaluation Patient Details Name: Alvin Lee MRN: [id number] DOB: 08/18/1951 Today's Date: 07/14/2020    History of Present Illness Pt is a 69 y/o male who presents s/p L2-L4 laminectomy/decompression on 07/14/2020. PMH significant for HTN, asthma, congenital RLE anomaly with prosthesis.    Clinical Impression   PTA, pt was living at home with his wife, pt was still working as a [employer] operational officer. He was independent with ADL/IADL and functional mobility with use of spc prn. Pt currently requires minguard for ADL and functional mobility. Educated pt on compensatory strategies for dressing, bathing, and grooming. Educated pt and wife on limiting sitting duration to about , back precautions, and safe car transfers. Patient evaluated by Occupational Therapy with no further acute OT needs identified. All education has been completed and the patient has no further questions. See below for any follow-up Occupational Therapy or equipment needs. OT to sign off. Thank you for referral.      Follow Up Recommendations  No OT follow up    Equipment Recommendations  None recommended by OT    Recommendations for Other Services       Precautions / Restrictions Precautions Precautions: Fall Required Braces or Orthoses: Other Brace Other Brace: No brace needed order for back; Has RLE prosthesis.  Restrictions Weight Bearing Restrictions: No      Mobility Bed Mobility Overal bed mobility: Modified Independent             General bed mobility comments: demonstrated log rolling technique to progress to EOB  Transfers Overall transfer level: Needs assistance Equipment used: Straight cane Transfers: Sit to/from Stand Sit to Stand: Min guard         General transfer comment: minguard for strategies to reduce trunk flexion with standing, able to minimize flexion with use of cane and pushing up from surface, due to prosthetic difficult to mitigate trunk  flexion    Balance Overall balance assessment: Mild deficits observed, not formally tested                                         ADL either performed or assessed with clinical judgement   ADL Overall ADL's : Needs assistance/impaired Eating/Feeding: Independent   Grooming: Min guard;Standing Grooming Details (indicate cue type and reason): educated pt on compensatory strategies  Upper Body Bathing: Modified independent   Lower Body Bathing: Min guard;Sit to/from stand   Upper Body Dressing : Modified independent;Sitting   Lower Body Dressing: Min guard;Sit to/from stand Lower Body Dressing Details (indicate cue type and reason): minguard for adherence to precautions Toilet Transfer: Min guard;Ambulation Toilet Transfer Details (indicate cue type and reason): minguard for safety Toileting- Clothing Manipulation and Hygiene: Sit to/from stand;Min guard Toileting - Clothing Manipulation Details (indicate cue type and reason): to reduce bending at waist     Functional mobility during ADLs: Min guard General ADL Comments: minguard for initial completion of ADL for adherence to back precautions;educated pt on compensatory strategies for ADL/IADL completion     Vision         Perception     Praxis      Pertinent Vitals/Pain Pain Assessment: No/denies pain     Hand Dominance Right   Extremity/Trunk Assessment Upper Extremity Assessment Upper Extremity Assessment: Overall WFL for tasks assessed   Lower Extremity Assessment Lower Extremity Assessment: RLE deficits/detail;Defer to PT evaluation RLE Deficits / Details: Pt with congenital  anomaly effecting RLE - wears full length prosthesis which does not have an articulating knee.  LLE Deficits / Details: no additional pain and numbness in L hip, leg, foot LLE Sensation: WNL   Cervical / Trunk Assessment Cervical / Trunk Assessment: Normal   Communication Communication Communication: No  difficulties   Cognition Arousal/Alertness: Awake/alert Behavior During Therapy: WFL for tasks assessed/performed Overall Cognitive Status: Within Functional Limits for tasks assessed                                 General Comments: demonstrated good ability to nagivate scenarios upon d/c for adherence to back precautions   General Comments  wife present during session    Exercises Exercises: Other exercises Other Exercises Other Exercises: educated pt on car transfers Other Exercises: educated pt on proper body mechanics with sit<>stand  Other Exercises: educated pt on limiting sitting duration to   Shoulder Instructions      Home Living Family/patient expects to be discharged to:: Private residence Living Arrangements: Spouse/significant other Available Help at Discharge: Family;Available 24 hours/day Type of Home: House Home Access: Level entry     Home Layout: Two level;Bed/Lee upstairs Alternate Level Stairs-Number of Steps: flight   Bathroom Shower/Tub: Producer, television/film/video: Standard     Home Equipment: Cane - single point;Shower seat;Shower seat - built in   Additional Comments: Pt has a house on [address] that he plans to go to recover. It has no stairs at all and a walk-in shower.       Prior Functioning/Environment Level of Independence: Independent        Comments: Pulmonologist        OT Problem List: Decreased range of motion;Decreased activity tolerance      OT Treatment/Interventions:      OT Goals(Current goals can be found in the care plan section) Acute Rehab OT Goals Patient Stated Goal[place] OT Goal Formulation: With patient Time For Goal Achievement: 07/21/20 Potential to Achieve Goals: Good  OT Frequency:     Barriers to D/C:            Co-evaluation              AM-PAC OT "6 Clicks" Daily Activity     Outcome Measure Help from another person eating meals?:  None Help from another person taking care of personal grooming?: A Little Help from another person toileting, which includes using toliet, bedpan, or urinal?: A Little Help from another person bathing (including washing, rinsing, drying)?: A Little Help from another person to put on and taking off regular upper body clothing?: None Help from another person to put on and taking off regular lower body clothing?: A Little 6 Click Score: 20   End of Session Equipment Utilized During Treatment: Other (comment) (cane) Nurse Communication: Mobility status  Activity Tolerance: Patient tolerated treatment well Patient left: in bed;with call bell/phone within reach  OT Visit Diagnosis: Other abnormalities of gait and mobility (R26.89)                Time: 4967-5916 OT Time Calculation (min): 28 min Charges:  OT General Charges $OT Visit: 1 Visit OT Evaluation $OT Eval Moderate Complexity: 1 Mod OT Treatments $Self Care/Home Management : 8-22 mins  Alvin Lee OTR/L Acute Rehabilitation Services Office: [phone]   Rebeca Alert 07/14/2020, 3:52 PM

## 2020-07-14 NOTE — Anesthesia Procedure Notes (Signed)
Procedure Name: Intubation Date/Time: 07/14/2020 7:41 AM Performed by: Janace Litten, CRNA Pre-anesthesia Checklist: Patient identified, Emergency Drugs available, Suction available and Patient being monitored Patient Re-evaluated:Patient Re-evaluated prior to induction Oxygen Delivery Method: Circle System Utilized Preoxygenation: Pre-oxygenation with 100% oxygen Induction Type: IV induction Ventilation: Mask ventilation without difficulty Laryngoscope Size: Mac and 4 Grade View: Grade I Tube type: Oral Tube size: 7.5 mm Number of attempts: 1 Airway Equipment and Method: Stylet Placement Confirmation: ETT inserted through vocal cords under direct vision,  positive ETCO2 and breath sounds checked- equal and bilateral Secured at: 22 cm Tube secured with: Tape Dental Injury: Teeth and Oropharynx as per pre-operative assessment  Comments: Performed by Vallarie Mare, srna

## 2020-07-14 NOTE — Evaluation (Signed)
Physical Therapy Evaluation Patient Details Name: Alvin Lee MRN: [id number] DOB: 27-Mar-1951 Today's Date: 07/14/2020   History of Present Illness  Pt is a 69 y/o male who presents s/p L2-L4 laminectomy/decompression on 07/14/2020. PMH significant for HTN, asthma, congenital RLE anomaly with prosthesis.     Clinical Impression  Pt admitted with above diagnosis. At the time of PT eval, pt was able to demonstrate transfers and ambulation with up to min assist for balance support and safety without an AD. Wife present and supportive throughout session. Pt and wife were educated on precautions, appropriate activity progression, and car transfer. Pt currently with functional limitations due to the deficits listed below (see PT Problem List). Pt will benefit from skilled PT to increase their independence and safety with mobility to allow discharge to the venue listed below.      Follow Up Recommendations No PT follow up;Supervision - Intermittent    Equipment Recommendations  None recommended by PT    Recommendations for Other Services       Precautions / Restrictions Precautions Precautions: Fall Required Braces or Orthoses: Other Brace Other Brace: No brace needed order for back; Has RLE prosthesis.  Restrictions Weight Bearing Restrictions: No      Mobility  Bed Mobility Overal bed mobility: Modified Independent             General bed mobility comments: VC's for log roll technique  Transfers Overall transfer level: Needs assistance Equipment used: None Transfers: Sit to/from Stand Sit to Stand: Supervision         General transfer comment: Light supervision provided. Pt with trunk flexion to stand due to prosthesis however unsure if this can be significantly improved.   Ambulation/Gait Ambulation/Gait assistance: Min guard;Min assist Gait Distance (Feet): 400 Feet Assistive device: None Gait Pattern/deviations: Step-through pattern;Decreased stride  length;Trunk flexed;Wide base of support Gait velocity: Mildly decreased Gait velocity interpretation: >2.62 ft/sec, indicative of community ambulatory General Gait Details: Pt with wider BOS and large amplitude lateral sway which is baseline. One LOB in which heavy min assist was required to recover. Otherwise, pt ambulating at a min guard level.   Stairs            Wheelchair Mobility    Modified Rankin (Stroke Patients Only)       Balance Overall balance assessment: Mild deficits observed, not formally tested                                           Pertinent Vitals/Pain Pain Assessment: No/denies pain    Home Living Family/patient expects to be discharged to:: Private residence Living Arrangements: Spouse/significant other Available Help at Discharge: Family;Available 24 hours/day Type of Home: House Home Access: Level entry     Home Layout: Two level;Bed/bath upstairs Home Equipment: Cane - single point;Shower seat;Shower seat - built in Additional Comments: Pt has a house on [address] that he plans to go to recover. It has no stairs at all and a walk-in shower.     Prior Function Level of Independence: Independent         Comments: Pulmonologist     Hand Dominance        Extremity/Trunk Assessment   Upper Extremity Assessment Upper Extremity Assessment: Overall WFL for tasks assessed    Lower Extremity Assessment Lower Extremity Assessment: RLE deficits/detail;LLE deficits/detail RLE Deficits / Details: Pt with congenital anomaly effecting  RLE - wears full length prosthesis which does not have an articulating knee.  LLE Deficits / Details: Pt reports symptoms in L hip, leg and foot are gone.  LLE Sensation: WNL    Cervical / Trunk Assessment Cervical / Trunk Assessment: Normal  Communication   Communication: No difficulties  Cognition Arousal/Alertness: Awake/alert Behavior During Therapy: WFL for tasks  assessed/performed Overall Cognitive Status: Within Functional Limits for tasks assessed                                        General Comments      Exercises     Assessment/Plan    PT Assessment Patient needs continued PT services  PT Problem List Decreased strength;Decreased activity tolerance;Decreased balance;Decreased mobility;Decreased knowledge of use of DME;Decreased safety awareness;Decreased knowledge of precautions       PT Treatment Interventions DME instruction;Gait training;Stair training;Functional mobility training;Therapeutic activities;Therapeutic exercise;Neuromuscular re-education;Patient/family education    PT Goals (Current goals can be found in the Care Plan section)  Acute Rehab PT Goals Patient Stated Goal: Return to PLOF; be active again PT Goal Formulation: With patient/family Time For Goal Achievement: 07/21/20 Potential to Achieve Goals: Good    Frequency Min 5X/week   Barriers to discharge        Co-evaluation               AM-PAC PT "6 Clicks" Mobility  Outcome Measure Help needed turning from your back to your side while in a flat bed without using bedrails?: None Help needed moving from lying on your back to sitting on the side of a flat bed without using bedrails?: None Help needed moving to and from a bed to a chair (including a wheelchair)?: None Help needed standing up from a chair using your arms (e.g., wheelchair or bedside chair)?: None Help needed to walk in hospital room?: A Little Help needed climbing 3-5 steps with a railing? : A Little 6 Click Score: 22    End of Session Equipment Utilized During Treatment: Gait belt Activity Tolerance: Patient tolerated treatment well Patient left: with family/visitor present;with call bell/phone within reach (Sitting EOB awaiting OT session) Nurse Communication: Mobility status PT Visit Diagnosis: Unsteadiness on feet (R26.81);Other symptoms and signs involving the  nervous system (R29.898);Difficulty in walking, not elsewhere classified (R26.2)    Time: 8299-3716 PT Time Calculation (min) (ACUTE ONLY): 27 min   Charges:   PT Evaluation $PT Eval Low Complexity: 1 Low PT Treatments $Gait Training: 8-22 mins        Conni Slipper, PT, DPT Acute Rehabilitation Services Pager: [phone] Office: [phone]   Marylynn Pearson 07/14/2020, 3:23 PM

## 2020-07-14 NOTE — Interval H&P Note (Signed)
History and Physical Interval Note:  07/14/2020 7:22 AM  Alvin Lee  has presented today for surgery, with the diagnosis of Lumbar foraminal stenosis.  The various methods of treatment have been discussed with the patient and family. After consideration of risks, benefits and other options for treatment, the patient has consented to  Procedure(s) with comments: Left Lumbar 2-3, Left Lumbar 3-4 Foraminotomies (Left) - 3C/RM 21 as a surgical intervention.  The patient's history has been reviewed, patient examined, no change in status, stable for surgery.  I have reviewed the patient's chart and labs.  Questions were answered to the patient's satisfaction.     Dorian Heckle

## 2020-07-14 NOTE — Anesthesia Postprocedure Evaluation (Signed)
Anesthesia Post Note  Patient: Alvin Lee  Procedure(s) Performed: Left Lumbar Two-Three, Left Lumbar Three-Four Foraminotomies (Left )     Patient location during evaluation: PACU Anesthesia Type: General Level of consciousness: awake and alert, oriented and patient cooperative Pain management: pain level controlled Vital Signs Assessment: post-procedure vital signs reviewed and stable Respiratory status: spontaneous breathing, nonlabored ventilation and respiratory function stable Cardiovascular status: blood pressure returned to baseline and stable Postop Assessment: no apparent nausea or vomiting Anesthetic complications: no   No complications documented.  Last Vitals:  Vitals:   07/14/20 0623 07/14/20 0925  BP: (!) 150/86 123/77  Pulse: 80 82  Resp: 18 (!) 21  Temp: 36.5 C 36.6 C  SpO2: 99% 94%    Last Pain:  Vitals:   07/14/20 0925  TempSrc:   PainSc: 0-No pain    LLE Motor Response: Purposeful movement;Responds to commands (07/14/20 0925) LLE Sensation: No numbness;No pain;No tingling (07/14/20 0925) RLE Motor Response: Purposeful movement;Responds to commands (07/14/20 0925) RLE Sensation: No numbness;No pain;No tingling (07/14/20 0925)      Lannie Fields

## 2020-07-14 NOTE — Op Note (Signed)
07/14/2020 ° °9:23 AM ° °PATIENT:  Alvin Lee  69 y.o. male ° °PRE-OPERATIVE DIAGNOSIS:  Lumbar foraminal stenosis, degenerative lumbar spinal stenosis, lumbago, radiculopathy L 23 and L 34 left ° °POST-OPERATIVE DIAGNOSIS:  Lumbar foraminal stenosis, degenerative lumbar spinal stenosis, lumbago, radiculopathy L 23 and L 34 left ° °PROCEDURE:  Procedure(s) with comments: °Left Lumbar Two-Three, Left Lumbar Three-Four Foraminotomies (Left) - Left Lumbar Two-Three, Left Lumbar Three-Four Foraminotomies with microdissection ° °SURGEON:  Surgeon(s) and Role: °   * Delando Satter, MD - Primary ° °PHYSICIAN ASSISTANT: McDaniel, NP ° °ASSISTANTS: Poteat, RN  ° °ANESTHESIA:   general ° °EBL:  100 mL  ° °BLOOD ADMINISTERED:none ° °DRAINS: none  ° °LOCAL MEDICATIONS USED:  MARCAINE    and LIDOCAINE  ° °SPECIMEN:  No Specimen ° °DISPOSITION OF SPECIMEN:  N/A ° °COUNTS:  YES ° °TOURNIQUET:  * No tourniquets in log * ° °DICTATION: Patient has left foraminal stenosis at L 23 and L 34 with significant left leg pain and weakness. It was elected to take him to surgery for L 23 and L 34 laminectomy and foraminotomy. ° °Procedure: Patient was brought to the operating room and following the smooth and uncomplicated induction of general endotracheal anesthesia he was placed in a prone position on the Wilson frame. Low back was prepped and draped in the usual sterile fashion with betadine scrub and DuraPrep. Preoperative localizing X ray was obtained with a spinal needle.  Area of planned incision was infiltrated with local lidocaine. Incision was made in the midline and carried to the lumbodorsal fascia which was incised on the left side of midline. Subperiosteal dissection was performed exposing what was felt to be L 23 and L 34 levels. Intraoperative x-ray demonstrated marker probe at L 23 and L 34 levels. A left foraminotomy of L 23 and L 34 levels was performed with  high-speed drill and completed with Kerrison rongeurs and  generous foraminotomy was performed to decompress the L 2, L 3, L 4 nerves.  Medial facetectomy was performed at each level.  The remainder of the procedure was performed with microdissection with use of the operating microscope.   Ligamentum flavum was detached and removed in a piecemeal fashion and the L 2, L 3, L 4 nerves were decompressed laterally with removal of the superior aspect of the facet and ligamentum causing nerve root compression. Angled curettes were used to detatch and then remove thickened compressive ligamentous material.  The discs were inspected and were found to be bulging without frank herniation.At this point it was felt that all neural elements were well decompressed. The wound was then irrigated with saline. Hemostasis was assured with Surgifoam and the interspace was irrigated with Depo-Medrol and fentanyl. The lumbodorsal fascia was closed with 0 Vicryl sutures the subcutaneous tissues reapproximated 2-0 Vicryl inverted sutures and the skin edges were reapproximated with 3-0 Vicryl subcuticular stitch. The wound is dressed with Dermabond and an occlusive dressing. Patient was extubated in the operating room and taken to recovery in stable and satisfactory condition having tolerated his operation well counts were correct at the end of the case. ° ° °PLAN OF CARE: Admit for overnight observation ° °PATIENT DISPOSITION:  PACU - hemodynamically stable. °  °Delay start of Pharmacological VTE agent (>24hrs) due to surgical blood loss or risk of bleeding: yes ° °

## 2020-07-14 NOTE — Brief Op Note (Signed)
07/14/2020  9:23 AM  PATIENT:  Alvin Lee  69 y.o. male  PRE-OPERATIVE DIAGNOSIS:  Lumbar foraminal stenosis, degenerative lumbar spinal stenosis, lumbago, radiculopathy L 23 and L 34 left  POST-OPERATIVE DIAGNOSIS:  Lumbar foraminal stenosis, degenerative lumbar spinal stenosis, lumbago, radiculopathy L 23 and L 34 left  PROCEDURE:  Procedure(s) with comments: Left Lumbar Two-Three, Left Lumbar Three-Four Foraminotomies (Left) - Left Lumbar Two-Three, Left Lumbar Three-Four Foraminotomies with microdissection  SURGEON:  Surgeon(s) and Role:    Maeola Harman, MD - Primary  PHYSICIAN ASSISTANT: Julien Girt, NP  ASSISTANTS: Poteat, RN   ANESTHESIA:   general  EBL:  100 mL   BLOOD ADMINISTERED:none  DRAINS: none   LOCAL MEDICATIONS USED:  MARCAINE    and LIDOCAINE   SPECIMEN:  No Specimen  DISPOSITION OF SPECIMEN:  N/A  COUNTS:  YES  TOURNIQUET:  * No tourniquets in log *  DICTATION: Patient has left foraminal stenosis at L 23 and L 34 with significant left leg pain and weakness. It was elected to take him to surgery for L 23 and L 34 laminectomy and foraminotomy.  Procedure: Patient was brought to the operating room and following the smooth and uncomplicated induction of general endotracheal anesthesia he was placed in a prone position on the Wilson frame. Low back was prepped and draped in the usual sterile fashion with betadine scrub and DuraPrep. Preoperative localizing X ray was obtained with a spinal needle.  Area of planned incision was infiltrated with local lidocaine. Incision was made in the midline and carried to the lumbodorsal fascia which was incised on the left side of midline. Subperiosteal dissection was performed exposing what was felt to be L 23 and L 34 levels. Intraoperative x-ray demonstrated marker probe at L 23 and L 34 levels. A left foraminotomy of L 23 and L 34 levels was performed with  high-speed drill and completed with Kerrison rongeurs and  generous foraminotomy was performed to decompress the L 2, L 3, L 4 nerves.  Medial facetectomy was performed at each level.  The remainder of the procedure was performed with microdissection with use of the operating microscope.   Ligamentum flavum was detached and removed in a piecemeal fashion and the L 2, L 3, L 4 nerves were decompressed laterally with removal of the superior aspect of the facet and ligamentum causing nerve root compression. Angled curettes were used to detatch and then remove thickened compressive ligamentous material.  The discs were inspected and were found to be bulging without frank herniation.At this point it was felt that all neural elements were well decompressed. The wound was then irrigated with saline. Hemostasis was assured with Surgifoam and the interspace was irrigated with Depo-Medrol and fentanyl. The lumbodorsal fascia was closed with 0 Vicryl sutures the subcutaneous tissues reapproximated 2-0 Vicryl inverted sutures and the skin edges were reapproximated with 3-0 Vicryl subcuticular stitch. The wound is dressed with Dermabond and an occlusive dressing. Patient was extubated in the operating room and taken to recovery in stable and satisfactory condition having tolerated his operation well counts were correct at the end of the case.   PLAN OF CARE: Admit for overnight observation  PATIENT DISPOSITION:  PACU - hemodynamically stable.   Delay start of Pharmacological VTE agent (>24hrs) due to surgical blood loss or risk of bleeding: yes

## 2020-07-15 ENCOUNTER — Encounter (HOSPITAL_COMMUNITY): Payer: Self-pay | Admitting: Neurosurgery

## 2020-07-15 DIAGNOSIS — M48061 Spinal stenosis, lumbar region without neurogenic claudication: Secondary | ICD-10-CM | POA: Diagnosis not present

## 2020-07-15 NOTE — Discharge Instructions (Signed)

## 2020-07-15 NOTE — Discharge Summary (Signed)
Physician Discharge Summary  Patient ID: Alvin Lee MRN: [id number] DOB/AGE: 1951-09-21 69 y.o.  Admit date: 07/14/2020 Discharge date: 07/15/2020  Admission Diagnoses: Lumbar foraminal stenosis, degenerative lumbar spinal stenosis, lumbago, radiculopathy L 23 and L 34 left  Discharge Diagnoses: Lumbar foraminal stenosis, degenerative lumbar spinal stenosis, lumbago, radiculopathy L 23 and L 34 left Active Problems:   Degenerative lumbar spinal stenosis   Discharged Condition: good  Hospital Course: Patient has left foraminal stenosis at L 23 and L 34 with significant left leg pain and weakness. It was elected to take him to surgery for L 23 and L 34 laminectomy and foraminotomy. Patient was brought to the operating room where he was intubated and prepared for surgery.  He underwent a Left Lumbar Two-Three, Left Lumbar Three-Four Foraminotomies (Left) - Left Lumbar Two-Three, Left Lumbar Three-Four Foraminotomies with microdissection.  The patient was then extubated in the operating room and taken to PACU in stable condition for recovery.  He was then transferred to [facility] for overnight observation.  He worked with physical therapy the evening after his surgery and has ambulated multiple times with no difficulties.  At this point he is stable and is much improved from his preoperative symptoms and is ready for discharge to home.  Consults: None  Significant Diagnostic Studies: radiology: X-rays  Treatments: surgery: Left Lumbar Two-Three, Left Lumbar Three-Four Foraminotomies (Left) - Left Lumbar Two-Three, Left Lumbar Three-Four Foraminotomies with microdissection  Discharge Exam: Blood pressure 115/64, pulse 95, temperature 99.6 F (37.6 C), temperature source Oral, resp. rate 18, height 5\' 11"  (1.803 m), weight 97.1 kg, SpO2 93 %.  Physical Exam: The patient is alert and oriented x3, conversant, and in good spirits.  He moves all extremities well with good strength bilaterally.   Dressing is clean dry intact.  Incision is well approximated with no erythema, drainage, or edema.   Disposition: Discharge disposition: 01-Home or Self Care        Allergies as of 07/15/2020      Reactions   Ace Inhibitors Other (See Comments)   Swelling in lips    Avelox [moxifloxacin] Other (See Comments)   Swelling in lips    Cleocin [clindamycin] Rash      Medication List    TAKE these medications   ibuprofen 200 MG tablet Commonly known as: ADVIL Take 600 mg by mouth in the morning and at bedtime.   losartan-hydrochlorothiazide 100-25 MG tablet Commonly known as: HYZAAR Take 1 tablet by mouth daily.   multivitamin with minerals tablet Take 1 tablet by mouth daily.   omeprazole 20 MG capsule Commonly known as: PRILOSEC Take 20 mg by mouth daily.   rosuvastatin 40 MG tablet Commonly known as: CRESTOR Take 40 mg by mouth at bedtime.   Vitamin D 50 MCG (2000 UT) tablet Take 2,000 Units by mouth daily.        Signed: 09/14/2020, DNP, AGNP-C 07/15/2020, 8:48 AM

## 2020-07-15 NOTE — Progress Notes (Signed)
Subjective: Patient reports an improvement in his preoperative pain and weakness symptoms.  He reports mild incisional pain and an improvement in his left quadriceps strength with an improvement in his ability to ambulate.  He stated that he was very happy with the outcome of his surgery and is very appreciative of Dr. Venetia Maxon and his team as well as the nursing staff.  No acute events reported overnight.  He has ambulated with nursing staff in the hallway.  He was seen by Physical therapy yesterday afternoon who recommends no PT follow-up.  Objective: Vital signs in last 24 hours: Temp:  [97.6 F (36.4 C)-99.6 F (37.6 C)] 99.6 F (37.6 C) (08/11 0735) Pulse Rate:  [68-101] 95 (08/11 0735) Resp:  [11-21] 18 (08/11 0735) BP: (115-147)/(64-86) 115/64 (08/11 0735) SpO2:  [92 %-100 %] 93 % (08/11 0735)  Intake/Output from previous day: 08/10 0701 - 08/11 0700 In: 2260 [P.O.:960; I.V.:1200; IV Piggyback:100] Out: 100 [Blood:100] Intake/Output this shift: No intake/output data recorded.  Physical Exam:  The patient is alert and oriented x3, conversant, and in good spirits.  He moves all extremities well with good strength bilaterally.  Dressing is clean dry intact.  Incision is well approximated with no erythema, drainage, or edema.   Lab Results: No results for input(s): WBC, HGB, HCT, PLT in the last 72 hours. BMET No results for input(s): NA, K, CL, CO2, GLUCOSE, BUN, CREATININE, CALCIUM in the last 72 hours.  Studies/Results: DG Lumbar Spine 2-3 Views  Result Date: 07/14/2020 CLINICAL DATA:  Intraoperative localization. EXAM: LUMBAR SPINE - 2-3 VIEW COMPARISON:  03/11/2020 MRI FINDINGS: Two intraoperative images. The first, labeled 8:05 a.m., demonstrates surgical devices projecting posterior to the L2-3 interspace. 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 The second image, labeled 8:17 a.m., demonstrates surgical devices projecting posterior to the L3-4 and L2-3 interspaces. IMPRESSION:  Intraoperative localization. Electronically Signed   By: Jeronimo Greaves M.D.   On: 07/14/2020 09:51    Assessment/Plan: The patient is postop day 1 S/P left L2-3 and left L3-4 foraminotomies.  He is recovering nicely from his surgery.  He has worked with Physical therapy is recommending no follow-up.  Continue mobilizing patient and working with ambulation and pain control. He will be seen by Physical therapy and Occupational therapy this morning.  The patient is stable and is ready for discharge to home.  Will plan for discharge later this morning.   LOS: 0 days     Council [employer]ic 07/15/2020, 7:47 AM

## 2020-07-15 NOTE — Progress Notes (Signed)
Physical Therapy Treatment Patient Details Name: Alvin Lee MRN: [id number] DOB: [birth year] Today's Date: 07/15/2020    History of Present Illness Pt is a 69 y/o male who presents s/p L2-L4 laminectomy/decompression on 07/14/2020. PMH significant for HTN, asthma, congenital RLE anomaly with prosthesis.     PT Comments    Pt with minimal pain, functioning at supervision. 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 Discussed at length the benefits and cons of a matching prosthesis. Pt to benefit from out pt  PT to help optimize R LE prosthesis kinematics. Pt with no further acute PT needs at this time. PT signing off.    Follow Up Recommendations  No PT follow up;Supervision - Intermittent (will benefit from outpt PT for prosthetic management)     Equipment Recommendations  None recommended by PT    Recommendations for Other Services       Precautions / Restrictions Precautions Precautions: Fall Required Braces or Orthoses: Other Brace Other Brace: pt with R prosthesis Restrictions Weight Bearing Restrictions: No    Mobility  Bed Mobility               General bed mobility comments: pt up in chair  Transfers Overall transfer level: Needs assistance Equipment used: Straight cane Transfers: Sit to/from Stand Sit to Stand: Supervision         General transfer comment: pt able to stand with minimal trunk flexion  Ambulation/Gait Ambulation/Gait assistance: Supervision Gait Distance (Feet): 300 Feet Assistive device: Straight cane Gait Pattern/deviations: Step-through pattern;Trendelenburg;Wide base of support Gait velocity: Mildly decreased Gait velocity interpretation: >2.62 ft/sec, indicative of community ambulatory General Gait Details: pt with R hip drop, R LE shorter than the other, significant R lateral lean, discussed at length regarding options for R LE  prosthesis and how to optimize kinematics to minimize deteriation on back   Stairs Stairs: Yes Stairs assistance: Min guard Stair Management: One rail Left;With cane;Forwards Number of Stairs: 12 General stair comments: pt with good technique, no LOB   Wheelchair Mobility    Modified Rankin (Stroke Patients Only)       Balance Overall balance assessment: Mild deficits observed, not formally tested                                          Cognition Arousal/Alertness: Awake/alert Behavior During Therapy: WFL for tasks assessed/performed Overall Cognitive Status: Within Functional Limits for tasks assessed                                        Exercises      General Comments General comments (skin integrity, edema, etc.): wife present, VSS      Pertinent Vitals/Pain Pain Assessment: 0-10 Pain Score: 1  Pain Location: incisional Pain Intervention(s): Monitored during session    Home Living                      Prior Function            PT Goals (current goals can now be found in the care plan section) Progress towards PT goals: Progressing toward goals    Frequency    Min 5X/week      PT Plan  Current plan remains appropriate    Co-evaluation              AM-PAC PT "6 Clicks" Mobility   Outcome Measure  Help needed turning from your back to your side while in a flat bed without using bedrails?: None Help needed moving from lying on your back to sitting on the side of a flat bed without using bedrails?: None Help needed moving to and from a bed to a chair (including a wheelchair)?: None Help needed standing up from a chair using your arms (e.g., wheelchair or bedside chair)?: None Help needed to walk in hospital room?: None Help needed climbing 3-5 steps with a railing? : A Little 6 Click Score: 23    End of Session Equipment Utilized During Treatment: Gait belt Activity Tolerance: Patient  tolerated treatment well Patient left: with family/visitor present;with call bell/phone within reach Nurse Communication: Mobility status PT Visit Diagnosis: Unsteadiness on feet (R26.81);Other symptoms and signs involving the nervous system (R29.898);Difficulty in walking, not elsewhere classified (R26.2)     Time: 7366-8159 PT Time Calculation (min) (ACUTE ONLY): 27 min  Charges:  $Gait Training: 23-37 mins                     Alvin Lee, PT, DPT Acute Rehabilitation Services Pager #: [phone] Office #: [phone]    Alvin Lee 07/15/2020, 11:04 AM

## 2020-07-15 NOTE — Progress Notes (Signed)
Pt doing well. Pt given D/C instructions with verbal understanding. Rx's were sent to the pharmacy by MD. Pt's incision is clean and dry with no sign of infection. Pt's IV was removed prior to D/C. Pt D/C'd home via wheelchair per MD order. Pt is stable @ D/C and has no other needs at this time. Nihaal Friesen, RN  

## 2021-05-28 IMAGING — CR DG LUMBAR SPINE 2-3V
2 series · 2 of 2 positions shown · non-contrast
Comparison: 03/11/2020 MRI

CLINICAL DATA: Intraoperative localization.

EXAM:
LUMBAR SPINE - 2-3 VIEW

[lateral (1 of 2)]
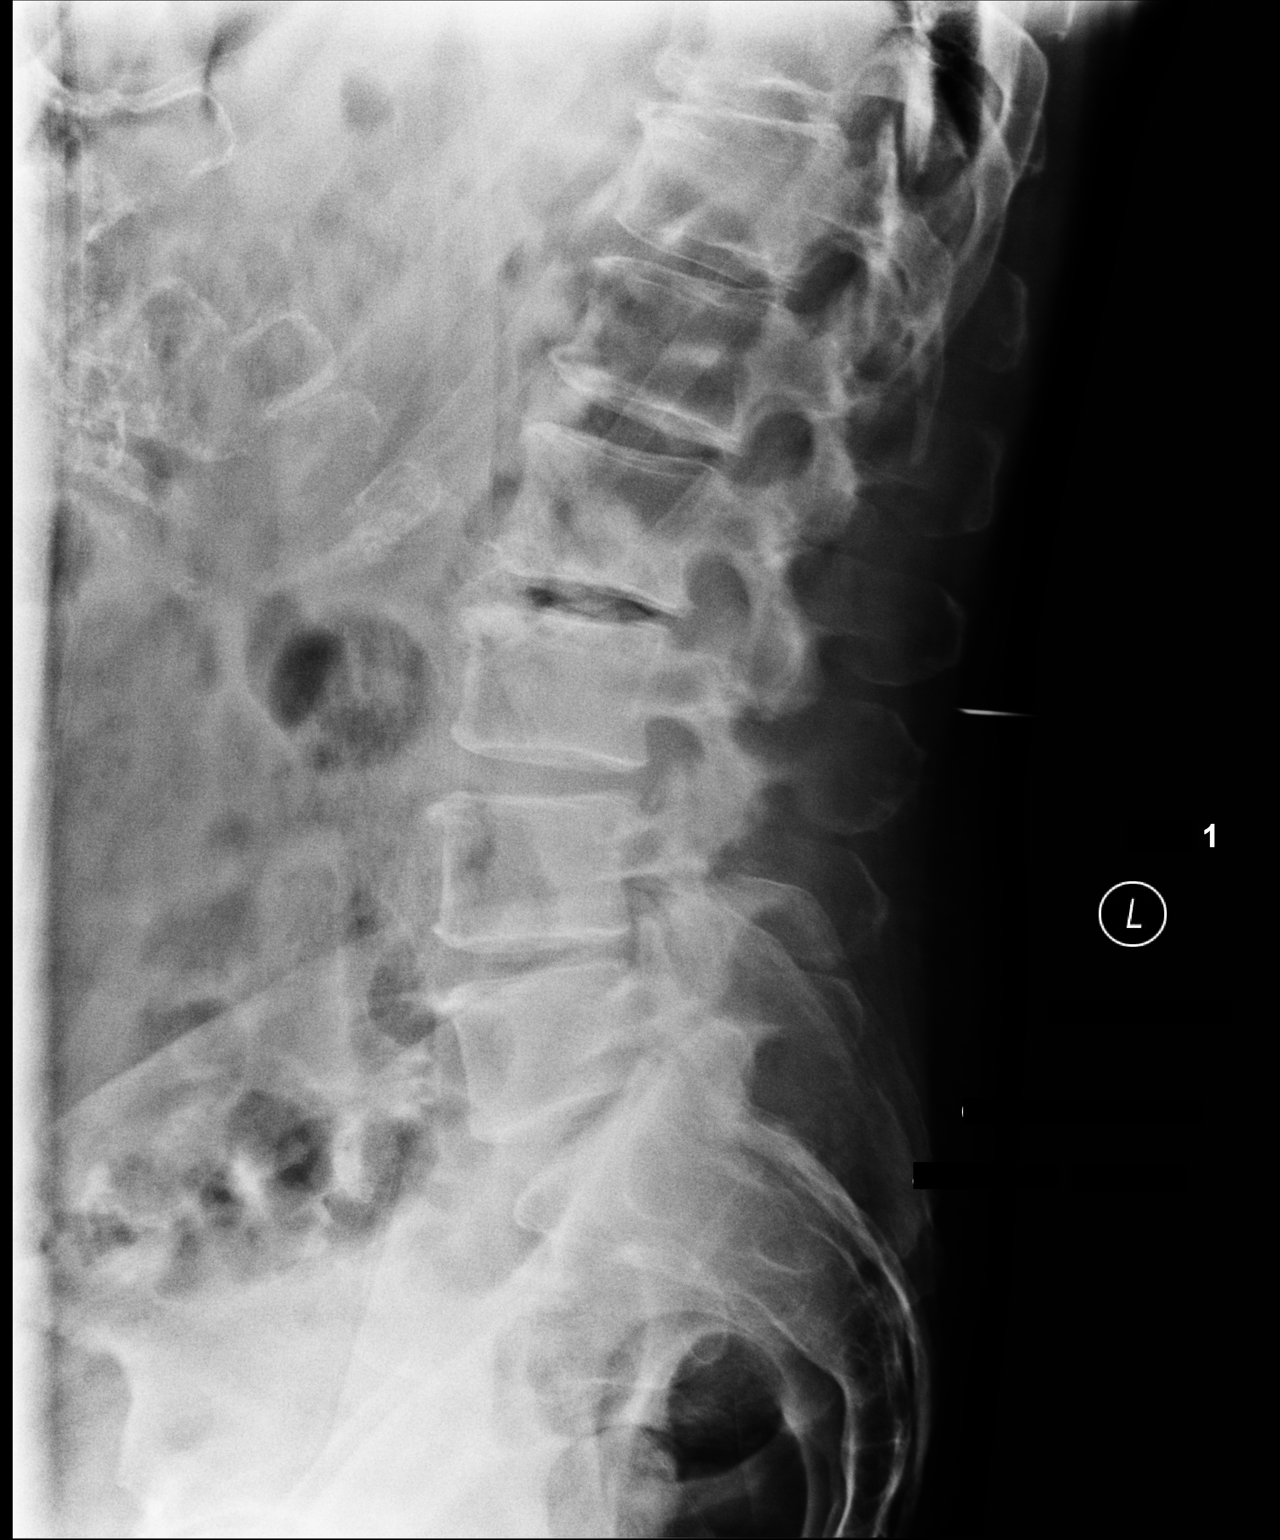

[lateral (2 of 2)]
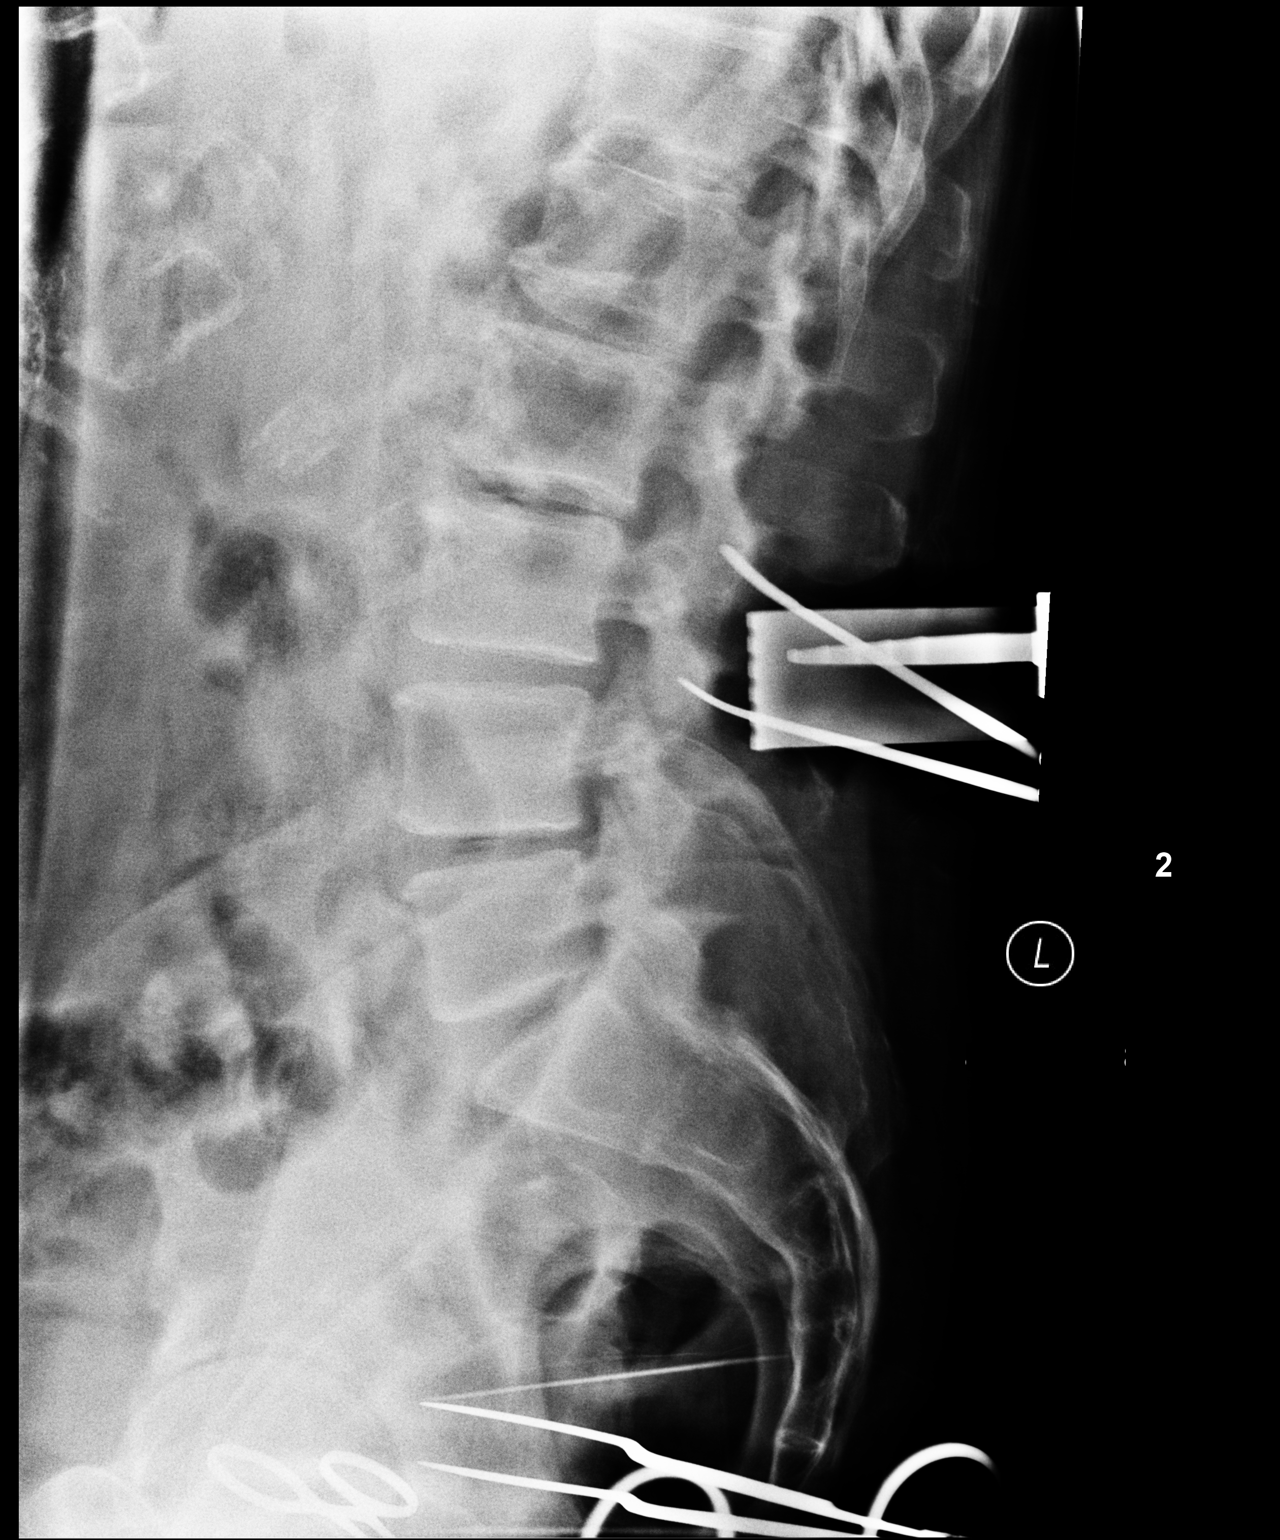

[2 of 2 positions shown; findings below may reference images not displayed]

FINDINGS: Two intraoperative images. The first, labeled [DATE] a.m.,
demonstrates surgical devices projecting posterior to the L2-3
interspace. Degenerate disc disease at L2-3.

The second image, labeled [DATE] a.m., demonstrates surgical devices
projecting posterior to the L3-4 and L2-3 interspaces.
IMPRESSION: Intraoperative localization.
